# Patient Record
Sex: Male | Born: 1955 | Race: Black or African American | Hispanic: No | Marital: Married | Smoking: Former smoker
Health system: Southern US, Community
[De-identification: ages and names within clinical notes are randomized; demographics above are authoritative.]

## PROBLEM LIST (undated history)

## (undated) DIAGNOSIS — E785 Hyperlipidemia, unspecified: Secondary | ICD-10-CM

## (undated) DIAGNOSIS — I10 Essential (primary) hypertension: Secondary | ICD-10-CM

## (undated) DIAGNOSIS — E119 Type 2 diabetes mellitus without complications: Secondary | ICD-10-CM

## (undated) HISTORY — DX: Type 2 diabetes mellitus without complications: E11.9

## (undated) HISTORY — DX: Essential (primary) hypertension: I10

## (undated) HISTORY — PX: OTHER SURGICAL HISTORY: SHX169

## (undated) HISTORY — DX: Hyperlipidemia, unspecified: E78.5

---

## 2000-06-26 ENCOUNTER — Encounter: Admission: RE | Admit: 2000-06-26 | Discharge: 2000-09-24 | Payer: Self-pay | Admitting: Emergency Medicine

## 2003-02-12 ENCOUNTER — Encounter: Admission: RE | Admit: 2003-02-12 | Discharge: 2003-02-12 | Payer: Self-pay | Admitting: Emergency Medicine

## 2003-02-22 ENCOUNTER — Encounter: Admission: RE | Admit: 2003-02-22 | Discharge: 2003-02-22 | Payer: Self-pay | Admitting: Emergency Medicine

## 2003-07-23 ENCOUNTER — Encounter: Admission: RE | Admit: 2003-07-23 | Discharge: 2003-07-23 | Payer: Self-pay | Admitting: Emergency Medicine

## 2003-10-07 ENCOUNTER — Ambulatory Visit (HOSPITAL_BASED_OUTPATIENT_CLINIC_OR_DEPARTMENT_OTHER): Admission: RE | Admit: 2003-10-07 | Discharge: 2003-10-07 | Payer: Self-pay | Admitting: Orthopedic Surgery

## 2003-10-07 ENCOUNTER — Ambulatory Visit (HOSPITAL_COMMUNITY): Admission: RE | Admit: 2003-10-07 | Discharge: 2003-10-07 | Payer: Self-pay | Admitting: Orthopedic Surgery

## 2008-12-29 ENCOUNTER — Ambulatory Visit (HOSPITAL_BASED_OUTPATIENT_CLINIC_OR_DEPARTMENT_OTHER): Admission: RE | Admit: 2008-12-29 | Discharge: 2008-12-29 | Payer: Self-pay | Admitting: Orthopedic Surgery

## 2009-03-21 ENCOUNTER — Emergency Department (HOSPITAL_COMMUNITY): Admission: EM | Admit: 2009-03-21 | Discharge: 2009-03-21 | Payer: Self-pay | Admitting: Emergency Medicine

## 2010-02-12 ENCOUNTER — Encounter: Payer: Self-pay | Admitting: Emergency Medicine

## 2010-04-25 LAB — GLUCOSE, CAPILLARY
Glucose-Capillary: 146 mg/dL — ABNORMAL HIGH (ref 70–99)
Glucose-Capillary: 177 mg/dL — ABNORMAL HIGH (ref 70–99)

## 2010-04-25 LAB — BASIC METABOLIC PANEL
BUN: 13 mg/dL (ref 6–23)
CO2: 31 mEq/L (ref 19–32)
Calcium: 9.1 mg/dL (ref 8.4–10.5)
Chloride: 103 mEq/L (ref 96–112)
Creatinine, Ser: 1.02 mg/dL (ref 0.4–1.5)
GFR calc Af Amer: >60 mL/min (ref >60)
GFR calc non Af Amer: >60 mL/min (ref >60)
Glucose, Bld: 178 mg/dL — ABNORMAL HIGH (ref 70–99)
Potassium: 3.8 mEq/L (ref 3.5–5.1)
Sodium: 137 mEq/L (ref 135–145)

## 2010-04-25 LAB — POCT HEMOGLOBIN-HEMACUE: Hemoglobin: 14.2 g/dL (ref 13.0–17.0)

## 2010-06-09 NOTE — Op Note (Signed)
NAME:  Andrew Woods, Andrew Woods NO.:  [id number]   MEDICAL RECORD NO.:  [id number]                   PATIENT TYPE:  AMB   LOCATION:  DSC                                  FACILITY:  [facility]   PHYSICIAN:  Loreta Ave, M.D.              DATE OF BIRTH:  02/27/1955   DATE OF PROCEDURE:  10/07/2003  DATE OF DISCHARGE:                                 OPERATIVE REPORT   PREOPERATIVE DIAGNOSES:  Chronic impingement with distal clavicle  osteolysis, left shoulder.  Complete rotator cuff tear anterior aspect.   POSTOPERATIVE DIAGNOSES:  Chronic impingement with distal clavicle  osteolysis, left shoulder.  Complete rotator cuff tear anterior aspect.  Associated anterior labrum tear.   OPERATION PERFORMED:  Left shoulder examination under anesthesia,  arthroscopy, debridement of labrum and undersurface of rotator cuff.  Acromioplasty with coracoacromial ligament release.  Bursectomy.  Excision  of distal clavicle.  Mini open repair, rotator cuff tear with FiberWire  suture and Concept repair system.   SURGEON:  Loreta Ave, M.D.   ASSISTANT:  Arlys John D. Petrarca, P.A.-C.   ANESTHESIA:  General.   ESTIMATED BLOOD LOSS:  Minimal.   SPECIMENS:  None.   CULTURES:  None.   COMPLICATIONS:  None.   DRESSING:  Soft compressive with shoulder immobilizer.   DESCRIPTION OF PROCEDURE:  The patient was brought to the operating room and  after adequate anesthesia had been obtained, the left shoulder was examined.  Full motion, good stability.  Placed in a beach chair position on the  shoulder positioner, prepped and draped in the usual sterile fashion.  Three  portals created, anterior, posterior, lateral.  Shoulder entered with blunt  obturator, distended and inspected.  Roughening complex tear in the anterior  labrum debrided.  Biceps tendon, biceps anchor, capsular ligamentous  structures all intact.  Articular cartilage looked good.  Undersurface  rotator cuff  tearing.  Anterior aspect supraspinatus.  A thin film of  capsule was still intact from below.  Displaced flaps debrided.  Cannula  redirected subacromially.  Type 3 acromion.  Bursa resected.  Cuff debrided.  Confirmation full thickness tearing anterior aspect supraspinatus tendon,  minimal retraction.  Remaining cuff looked good.  Acromioplasty to a type 1  acromion with a shaver and high speed bur.  CA ligament released with  cautery.  Distal clavicle grade 4 changes with osteolysis.  Lateral  centimeter resected.  Adequacy of decompression and clavicle excision  confirmed viewing from all portals.  Instruments and fluid removed.  Deltoid  splitting incision through lateral portal.  Subacromial space accessed.  Adequacy of decompression confirmed.  Cuff debrided back to healthy tissue  over the anterior half supraspinatus tendon.  Mobilized and then captured  with a weave FiberWire suture.  Reimplanted down the tuberosity at an area  of bone that had been roughened with a bur.  Concept repair system used to  weave the FiberWire  sutures through the humerus and then firmly tied over a  bony bridge laterally.  Nice firm watertight closure, reattachment of the  cuff and good repair.  Full passive motion without undue tension.  Wound  irrigated.  Deltoid and skin closed with Vicryl. Portals closed with nylon.  Margins of wound injected with Marcaine.  Sterile compressive dressing  applied.  Anesthesia reversed.  Brought to recovery room.  Tolerated surgery  well without complication.                                               Loreta Ave, M.D.    DFM/MEDQ  D:  10/07/2003  T:  10/07/2003  Job:  [id number]

## 2013-04-09 ENCOUNTER — Ambulatory Visit (INDEPENDENT_AMBULATORY_CARE_PROVIDER_SITE_OTHER): Payer: 59 | Admitting: Podiatry

## 2013-04-09 ENCOUNTER — Encounter: Payer: Self-pay | Admitting: Podiatry

## 2013-04-09 VITALS — BP 112/67 | HR 82 | Resp 16 | Ht 68.0 in | Wt 190.0 lb

## 2013-04-09 DIAGNOSIS — B372 Candidiasis of skin and nail: Secondary | ICD-10-CM

## 2013-04-09 DIAGNOSIS — L259 Unspecified contact dermatitis, unspecified cause: Secondary | ICD-10-CM

## 2013-04-09 MED ORDER — TERBINAFINE HCL 250 MG PO TABS: 250.0000 mg | ORAL_TABLET | Freq: Every day | ORAL | Status: DC

## 2013-04-09 NOTE — Progress Notes (Signed)
   Subjective:    Patient ID: Andrew Woods, male    DOB: Feb 24, 1955, 58 y.o.   MRN: [id number]  HPI Comments: "I have these moist areas between my toes"  Patient c/o of macerated areas between toes bilateral, especially between 3rd and 4th toes bilateral. They have been like this for years, just getting worse recently. Says they itch a lot. The bottoms of feet peel and crack. Tried using a lotion to dry the areas up-no help. PCP thought fungus and referred here for treatment.     Review of Systems  All other systems reviewed and are negative.       Objective:   Physical Exam        Assessment & Plan:

## 2013-04-09 NOTE — Patient Instructions (Addendum)
Diabetes and Foot Care Diabetes may cause you to have problems because of poor blood supply (circulation) to your feet and legs. This may cause the skin on your feet to become thinner, break easier, and heal more slowly. Your skin may become dry, and the skin may peel and crack. You may also have nerve damage in your legs and feet causing decreased feeling in them. You may not notice minor injuries to your feet that could lead to infections or more serious problems. Taking care of your feet is one of the most important things you can do for yourself.  HOME CARE INSTRUCTIONS  Wear shoes at all times, even in the house. Do not go barefoot. Bare feet are easily injured.  Check your feet daily for blisters, cuts, and redness. If you cannot see the bottom of your feet, use a mirror or ask someone for help.  Wash your feet with warm water (do not use hot water) and mild soap. Then pat your feet and the areas between your toes until they are completely dry. Do not soak your feet as this can dry your skin.  Apply a moisturizing lotion or petroleum jelly (that does not contain alcohol and is unscented) to the skin on your feet and to dry, brittle toenails. Do not apply lotion between your toes.  Trim your toenails straight across. Do not dig under them or around the cuticle. File the edges of your nails with an emery board or nail file.  Do not cut corns or calluses or try to remove them with medicine.  Wear clean socks or stockings every day. Make sure they are not too tight. Do not wear knee-high stockings since they may decrease blood flow to your legs.  Wear shoes that fit properly and have enough cushioning. To break in new shoes, wear them for just a few hours a day. This prevents you from injuring your feet. Always look in your shoes before you put them on to be sure there are no objects inside.  Do not cross your legs. This may decrease the blood flow to your feet.  If you find a minor scrape,  cut, or break in the skin on your feet, keep it and the skin around it clean and dry. These areas may be cleansed with mild soap and water. Do not cleanse the area with peroxide, alcohol, or iodine.  When you remove an adhesive bandage, be sure not to damage the skin around it.  If you have a wound, look at it several times a day to make sure it is healing.  Do not use heating pads or hot water bottles. They may burn your skin. If you have lost feeling in your feet or legs, you may not know it is happening until it is too late.  Make sure your health care provider performs a complete foot exam at least annually or more often if you have foot problems. Report any cuts, sores, or bruises to your health care provider immediately. SEEK MEDICAL CARE IF:   You have an injury that is not healing.  You have cuts or breaks in the skin.  You have an ingrown nail.  You notice redness on your legs or feet.  You feel burning or tingling in your legs or feet.  You have pain or cramps in your legs and feet.  Your legs or feet are numb.  Your feet always feel cold. SEEK IMMEDIATE MEDICAL CARE IF:   There is increasing redness,   swelling, or pain in or around a wound.  There is a red line that goes up your leg.  Pus is coming from a wound.  You develop a fever or as directed by your health care provider.  You notice a bad smell coming from an ulcer or wound. Document Released: 01/06/2000 Document Revised: 09/10/2012 Document Reviewed: 06/17/2012 [facility] Patient Information 2014 [place].  vaseline 2x week before bed and wrap in saran wrap and white sock

## 2013-04-10 NOTE — Progress Notes (Signed)
Subjective:     Patient ID: Andrew Woods, male   DOB: 1955-09-10, 58 y.o.   MRN: [id number]  HPI patient presents with six-year history of diabetes and dryness of his plantar skin and irritation between the digits of both feet of long-term duration  Review of Systems  All other systems reviewed and are negative.       Objective:   Physical Exam  Nursing note and vitals reviewed. Constitutional: He is oriented to person, place, and time.  Cardiovascular: Intact distal pulses.   Musculoskeletal: Normal range of motion.  Neurological: He is oriented to person, place, and time.  Skin: Skin is warm.   neurovascular status intact with muscle strength adequate and sharp dull vibratory intact. Significant dryness plantar aspect of both feet and irritation between the lesser digits of both feet with normal range of motion and no equinus condition noted.    Assessment:     Doing well with diabetes and his feet but does have dry foot condition    Plan:     Recommended Vaseline under occlusion revitaderm and Naftin for him between his toes. Reappoint her recheck as needed

## 2013-04-29 ENCOUNTER — Encounter: Payer: 59 | Attending: Geriatric Medicine | Admitting: *Deleted

## 2013-04-29 ENCOUNTER — Encounter: Payer: Self-pay | Admitting: *Deleted

## 2013-04-29 VITALS — Ht 68.0 in | Wt 196.9 lb

## 2013-04-29 DIAGNOSIS — Z713 Dietary counseling and surveillance: Secondary | ICD-10-CM | POA: Insufficient documentation

## 2013-04-29 DIAGNOSIS — E119 Type 2 diabetes mellitus without complications: Secondary | ICD-10-CM | POA: Insufficient documentation

## 2013-04-29 NOTE — Progress Notes (Signed)
Appt start time: 1100 end time:  1230.  Assessment:  Patient was seen on  04/29/13 for individual diabetes education. History of Diabetes for past 10 years. Lives with wife, she shops for the food and cooks most meals. 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 SMBG once a day or more with reported range of 30 day average of 115 mg/dl and 14 day average of 95 as FBG. He states he has changed his eating habits and reduced beer intake over the past month. He enjoys motorcycling and bicycling.  States he ate more sweets during the holidays, but last month he has been much more careful.  Current HbA1c: 9.0%  Preferred Learning Style:   No preference indicated   Learning Readiness:   Ready  Change in progress  MEDICATIONS: see list, diabetes medications are Glipizide, Metformin and Tradjenta  DIETARY INTAKE:  24-hr recall:  B ( AM): biscuit from fast food OR fruit and fiber cereal with skim milk and banana, water to drink Snk ( AM): occasionally PNB crackers  L ( PM): brings from home: lean meat sandwich, with chips and or canned fruit, water or diet soda Snk ( PM): unsalted nuts and fresh fruit D ( PM): meat, vegetables, starchy vegetables, occasionally a whole grain starch, water Snk ( PM): not usually, maybe a handful of chips occasionally Beverages: water, diet soda  Usual physical activity: is active with his job, does yard work, occasionally rides bike  Estimated energy needs: 1600 calories 180 g carbohydrates 120 g protein 44 g fat  Progress Towards Goal(s):  Resolved.   Nutritional Diagnosis:  NB-1.1 Food and nutrition-related knowledge deficit As related to diabetes management.  As evidenced by history of A1c of 9.0%.    Intervention:  Nutrition counseling provided.  Discussed diabetes disease process and treatment options.  Discussed physiology of diabetes and role of obesity on insulin resistance.  Encouraged moderate weight  reduction to improve glucose levels.  Discussed role of medications and diet in glucose control  Provided education on macronutrients on glucose levels.  Provided education on carb counting, importance of regularly scheduled meals/snacks, and meal planning  Discussed effects of physical activity on glucose levels and long-term glucose control.  Recommended goal of 150 minutes of physical activity/week.  Reviewed patient medications.  Discussed role of medication on blood glucose and possible side effects  Discussed blood glucose monitoring and interpretation.  Discussed recommended target ranges and individual ranges.    Described short-term complications: hyper- and hypo-glycemia.  Discussed causes,symptoms, and treatment options.  Discussed prevention, detection, and treatment of long-term complications.  Discussed the role of prolonged elevated glucose levels on body systems.  Discussed role of stress on blood glucose levels and discussed strategies to manage psychosocial issues.  Discussed recommendations for long-term diabetes self-care.  Provided checklist for medical, dental, and emotional self-care.  Plan:  Aim for 4 Carb Choices per meal (60 grams) +/- 1 either way  Aim for 0-2 Carbs per snack if hungry Include protein in moderation with your meals and snacks as needed  Consider reading food labels for Total Carbohydrate of foods Consider  increasing your activity level as tolerated Consider checking BG at alternate times per day  Teaching Method Utilized: Visual, Auditory and Hands on  Handouts given during visit include: Living Well with Diabetes Carb Counting and Food Label handouts Meal Plan Card Diabetes Medication List  Barriers to learning/adherence to lifestyle change: none  Diabetes self-care support plan:  [facility] support group  Demonstrated degree of understanding via:  Teach Back   Monitoring/Evaluation:  Dietary intake, exercise, reading food labels,  SMBG, and body weight prn.

## 2013-04-29 NOTE — Patient Instructions (Signed)
Plan:  Aim for 4 Carb Choices per meal (60 grams) +/- 1 either way  Aim for 0-2 Carbs per snack if hungry Include protein in moderation with your meals and snacks as needed  Consider reading food labels for Total Carbohydrate of foods Consider  increasing your activity level as tolerated Consider checking BG at alternate times per day

## 2013-05-03 ENCOUNTER — Other Ambulatory Visit: Payer: Self-pay | Admitting: Podiatry

## 2013-05-04 NOTE — Telephone Encounter (Signed)
If pt continues to have problems, he needs to be evaluated.

## 2013-05-08 ENCOUNTER — Ambulatory Visit: Payer: Self-pay | Admitting: Dietician

## 2013-06-11 ENCOUNTER — Ambulatory Visit: Payer: 59 | Admitting: Podiatry

## 2015-02-09 ENCOUNTER — Encounter: Payer: Self-pay | Admitting: Podiatry

## 2015-02-09 ENCOUNTER — Ambulatory Visit (INDEPENDENT_AMBULATORY_CARE_PROVIDER_SITE_OTHER): Payer: 59 | Admitting: Podiatry

## 2015-02-09 VITALS — BP 131/80 | HR 82 | Resp 16

## 2015-02-09 DIAGNOSIS — B372 Candidiasis of skin and nail: Secondary | ICD-10-CM | POA: Diagnosis not present

## 2015-02-09 MED ORDER — TERBINAFINE HCL 250 MG PO TABS: 250.0000 mg | ORAL_TABLET | Freq: Every day | ORAL | Status: DC

## 2015-02-10 NOTE — Progress Notes (Signed)
Subjective:     Patient ID: Andrew Woods, male   DOB: [birth year], 58 y.o.   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 He states he gets regular liver function studies and they have been normal

## 2015-03-17 ENCOUNTER — Other Ambulatory Visit: Payer: Self-pay | Admitting: Podiatry

## 2015-03-17 NOTE — Telephone Encounter (Signed)
Pt has completed #60 doses of Lamisil for interdigital fungal infection, and is requesting refill.

## 2015-04-28 ENCOUNTER — Other Ambulatory Visit: Payer: Self-pay | Admitting: Podiatry

## 2015-04-28 NOTE — Telephone Encounter (Signed)
[facility] sent electronic notice for Terbinafine refill.  I spoke with pt and he states he doesn't need that medication.  I encouraged pt to contact [facility] and get correct medication ordered.  Pt agreed.

## 2016-03-23 DIAGNOSIS — I10 Essential (primary) hypertension: Secondary | ICD-10-CM | POA: Diagnosis not present

## 2016-03-23 DIAGNOSIS — E1165 Type 2 diabetes mellitus with hyperglycemia: Secondary | ICD-10-CM | POA: Diagnosis not present

## 2016-05-24 DIAGNOSIS — H524 Presbyopia: Secondary | ICD-10-CM | POA: Diagnosis not present

## 2016-05-24 DIAGNOSIS — H2513 Age-related nuclear cataract, bilateral: Secondary | ICD-10-CM | POA: Diagnosis not present

## 2016-05-24 DIAGNOSIS — E119 Type 2 diabetes mellitus without complications: Secondary | ICD-10-CM | POA: Diagnosis not present

## 2016-06-29 DIAGNOSIS — E1165 Type 2 diabetes mellitus with hyperglycemia: Secondary | ICD-10-CM | POA: Diagnosis not present

## 2016-09-28 DIAGNOSIS — E119 Type 2 diabetes mellitus without complications: Secondary | ICD-10-CM | POA: Diagnosis not present

## 2016-09-28 DIAGNOSIS — E1165 Type 2 diabetes mellitus with hyperglycemia: Secondary | ICD-10-CM | POA: Diagnosis not present

## 2016-09-28 DIAGNOSIS — I10 Essential (primary) hypertension: Secondary | ICD-10-CM | POA: Diagnosis not present

## 2016-09-28 DIAGNOSIS — Z79899 Other long term (current) drug therapy: Secondary | ICD-10-CM | POA: Diagnosis not present

## 2017-01-11 DIAGNOSIS — I10 Essential (primary) hypertension: Secondary | ICD-10-CM | POA: Diagnosis not present

## 2017-01-11 DIAGNOSIS — E7800 Pure hypercholesterolemia, unspecified: Secondary | ICD-10-CM | POA: Diagnosis not present

## 2017-01-11 DIAGNOSIS — E119 Type 2 diabetes mellitus without complications: Secondary | ICD-10-CM | POA: Diagnosis not present

## 2017-01-11 DIAGNOSIS — Z0000 Encounter for general adult medical examination without abnormal findings: Secondary | ICD-10-CM | POA: Diagnosis not present

## 2017-01-11 DIAGNOSIS — Z79899 Other long term (current) drug therapy: Secondary | ICD-10-CM | POA: Diagnosis not present

## 2017-03-15 ENCOUNTER — Other Ambulatory Visit: Payer: Self-pay | Admitting: Geriatric Medicine

## 2017-03-15 DIAGNOSIS — I10 Essential (primary) hypertension: Secondary | ICD-10-CM | POA: Diagnosis not present

## 2017-03-15 DIAGNOSIS — M5441 Lumbago with sciatica, right side: Secondary | ICD-10-CM | POA: Diagnosis not present

## 2017-03-15 DIAGNOSIS — E119 Type 2 diabetes mellitus without complications: Secondary | ICD-10-CM | POA: Diagnosis not present

## 2017-03-20 ENCOUNTER — Other Ambulatory Visit: Payer: Self-pay

## 2017-03-22 ENCOUNTER — Other Ambulatory Visit: Payer: Self-pay

## 2017-04-19 DIAGNOSIS — I10 Essential (primary) hypertension: Secondary | ICD-10-CM | POA: Diagnosis not present

## 2017-04-19 DIAGNOSIS — E1165 Type 2 diabetes mellitus with hyperglycemia: Secondary | ICD-10-CM | POA: Diagnosis not present

## 2017-04-19 DIAGNOSIS — M5441 Lumbago with sciatica, right side: Secondary | ICD-10-CM | POA: Diagnosis not present

## 2017-04-29 ENCOUNTER — Other Ambulatory Visit: Payer: Self-pay

## 2017-05-11 ENCOUNTER — Ambulatory Visit
Admission: RE | Admit: 2017-05-11 | Discharge: 2017-05-11 | Disposition: A | Discharge status: Home / Self Care | Payer: 59 | Source: Ambulatory Visit | Attending: Geriatric Medicine | Admitting: Geriatric Medicine

## 2017-05-11 DIAGNOSIS — M5416 Radiculopathy, lumbar region: Secondary | ICD-10-CM | POA: Diagnosis not present

## 2017-05-11 DIAGNOSIS — M5441 Lumbago with sciatica, right side: Secondary | ICD-10-CM

## 2017-05-15 DIAGNOSIS — S30871A Other superficial bite of abdominal wall, initial encounter: Secondary | ICD-10-CM | POA: Diagnosis not present

## 2017-05-15 DIAGNOSIS — S20479A Other superficial bite of unspecified back wall of thorax, initial encounter: Secondary | ICD-10-CM | POA: Diagnosis not present

## 2017-05-15 DIAGNOSIS — S40869A Insect bite (nonvenomous) of unspecified upper arm, initial encounter: Secondary | ICD-10-CM | POA: Diagnosis not present

## 2017-05-17 DIAGNOSIS — E119 Type 2 diabetes mellitus without complications: Secondary | ICD-10-CM | POA: Diagnosis not present

## 2017-05-17 DIAGNOSIS — E1165 Type 2 diabetes mellitus with hyperglycemia: Secondary | ICD-10-CM | POA: Diagnosis not present

## 2017-05-17 DIAGNOSIS — I10 Essential (primary) hypertension: Secondary | ICD-10-CM | POA: Diagnosis not present

## 2017-05-24 DIAGNOSIS — H524 Presbyopia: Secondary | ICD-10-CM | POA: Diagnosis not present

## 2017-05-24 DIAGNOSIS — Z87828 Personal history of other (healed) physical injury and trauma: Secondary | ICD-10-CM | POA: Diagnosis not present

## 2017-05-24 DIAGNOSIS — E119 Type 2 diabetes mellitus without complications: Secondary | ICD-10-CM | POA: Diagnosis not present

## 2017-05-24 DIAGNOSIS — L299 Pruritus, unspecified: Secondary | ICD-10-CM | POA: Diagnosis not present

## 2017-05-24 DIAGNOSIS — H349 Unspecified retinal vascular occlusion: Secondary | ICD-10-CM | POA: Diagnosis not present

## 2017-05-30 DIAGNOSIS — M5416 Radiculopathy, lumbar region: Secondary | ICD-10-CM | POA: Diagnosis not present

## 2017-05-30 DIAGNOSIS — M5136 Other intervertebral disc degeneration, lumbar region: Secondary | ICD-10-CM | POA: Diagnosis not present

## 2017-06-04 DIAGNOSIS — H34831 Tributary (branch) retinal vein occlusion, right eye, with macular edema: Secondary | ICD-10-CM | POA: Diagnosis not present

## 2017-06-19 DIAGNOSIS — M25551 Pain in right hip: Secondary | ICD-10-CM | POA: Diagnosis not present

## 2017-06-19 DIAGNOSIS — M545 Low back pain: Secondary | ICD-10-CM | POA: Diagnosis not present

## 2017-06-19 DIAGNOSIS — M25651 Stiffness of right hip, not elsewhere classified: Secondary | ICD-10-CM | POA: Diagnosis not present

## 2017-06-21 DIAGNOSIS — M545 Low back pain: Secondary | ICD-10-CM | POA: Diagnosis not present

## 2017-06-21 DIAGNOSIS — M25551 Pain in right hip: Secondary | ICD-10-CM | POA: Diagnosis not present

## 2017-06-21 DIAGNOSIS — M25651 Stiffness of right hip, not elsewhere classified: Secondary | ICD-10-CM | POA: Diagnosis not present

## 2017-06-27 DIAGNOSIS — M25551 Pain in right hip: Secondary | ICD-10-CM | POA: Diagnosis not present

## 2017-06-27 DIAGNOSIS — M545 Low back pain: Secondary | ICD-10-CM | POA: Diagnosis not present

## 2017-06-27 DIAGNOSIS — M25651 Stiffness of right hip, not elsewhere classified: Secondary | ICD-10-CM | POA: Diagnosis not present

## 2017-06-28 DIAGNOSIS — M25651 Stiffness of right hip, not elsewhere classified: Secondary | ICD-10-CM | POA: Diagnosis not present

## 2017-06-28 DIAGNOSIS — M25551 Pain in right hip: Secondary | ICD-10-CM | POA: Diagnosis not present

## 2017-06-28 DIAGNOSIS — M545 Low back pain: Secondary | ICD-10-CM | POA: Diagnosis not present

## 2017-07-02 DIAGNOSIS — H34831 Tributary (branch) retinal vein occlusion, right eye, with macular edema: Secondary | ICD-10-CM | POA: Diagnosis not present

## 2017-07-02 DIAGNOSIS — E119 Type 2 diabetes mellitus without complications: Secondary | ICD-10-CM | POA: Diagnosis not present

## 2017-07-03 DIAGNOSIS — M545 Low back pain: Secondary | ICD-10-CM | POA: Diagnosis not present

## 2017-07-03 DIAGNOSIS — M25551 Pain in right hip: Secondary | ICD-10-CM | POA: Diagnosis not present

## 2017-07-03 DIAGNOSIS — M25651 Stiffness of right hip, not elsewhere classified: Secondary | ICD-10-CM | POA: Diagnosis not present

## 2017-07-05 DIAGNOSIS — M25651 Stiffness of right hip, not elsewhere classified: Secondary | ICD-10-CM | POA: Diagnosis not present

## 2017-07-05 DIAGNOSIS — M545 Low back pain: Secondary | ICD-10-CM | POA: Diagnosis not present

## 2017-07-05 DIAGNOSIS — M25551 Pain in right hip: Secondary | ICD-10-CM | POA: Diagnosis not present

## 2017-07-09 DIAGNOSIS — M25651 Stiffness of right hip, not elsewhere classified: Secondary | ICD-10-CM | POA: Diagnosis not present

## 2017-07-09 DIAGNOSIS — M25551 Pain in right hip: Secondary | ICD-10-CM | POA: Diagnosis not present

## 2017-07-09 DIAGNOSIS — M545 Low back pain: Secondary | ICD-10-CM | POA: Diagnosis not present

## 2017-07-12 DIAGNOSIS — M25651 Stiffness of right hip, not elsewhere classified: Secondary | ICD-10-CM | POA: Diagnosis not present

## 2017-07-12 DIAGNOSIS — M25551 Pain in right hip: Secondary | ICD-10-CM | POA: Diagnosis not present

## 2017-07-12 DIAGNOSIS — M545 Low back pain: Secondary | ICD-10-CM | POA: Diagnosis not present

## 2017-07-15 DIAGNOSIS — M25551 Pain in right hip: Secondary | ICD-10-CM | POA: Diagnosis not present

## 2017-07-15 DIAGNOSIS — M545 Low back pain: Secondary | ICD-10-CM | POA: Diagnosis not present

## 2017-07-15 DIAGNOSIS — M25651 Stiffness of right hip, not elsewhere classified: Secondary | ICD-10-CM | POA: Diagnosis not present

## 2017-07-16 DIAGNOSIS — H34831 Tributary (branch) retinal vein occlusion, right eye, with macular edema: Secondary | ICD-10-CM | POA: Diagnosis not present

## 2017-07-29 DIAGNOSIS — M25551 Pain in right hip: Secondary | ICD-10-CM | POA: Diagnosis not present

## 2017-07-29 DIAGNOSIS — M545 Low back pain: Secondary | ICD-10-CM | POA: Diagnosis not present

## 2017-07-29 DIAGNOSIS — M25651 Stiffness of right hip, not elsewhere classified: Secondary | ICD-10-CM | POA: Diagnosis not present

## 2017-08-29 DIAGNOSIS — M5416 Radiculopathy, lumbar region: Secondary | ICD-10-CM | POA: Diagnosis not present

## 2017-09-09 DIAGNOSIS — E118 Type 2 diabetes mellitus with unspecified complications: Secondary | ICD-10-CM | POA: Diagnosis not present

## 2017-09-09 DIAGNOSIS — H5213 Myopia, bilateral: Secondary | ICD-10-CM | POA: Diagnosis not present

## 2017-09-09 DIAGNOSIS — H348312 Tributary (branch) retinal vein occlusion, right eye, stable: Secondary | ICD-10-CM | POA: Diagnosis not present

## 2017-09-20 DIAGNOSIS — Z79899 Other long term (current) drug therapy: Secondary | ICD-10-CM | POA: Diagnosis not present

## 2017-09-20 DIAGNOSIS — E7800 Pure hypercholesterolemia, unspecified: Secondary | ICD-10-CM | POA: Diagnosis not present

## 2017-09-20 DIAGNOSIS — E1165 Type 2 diabetes mellitus with hyperglycemia: Secondary | ICD-10-CM | POA: Diagnosis not present

## 2017-09-20 DIAGNOSIS — I10 Essential (primary) hypertension: Secondary | ICD-10-CM | POA: Diagnosis not present

## 2017-09-20 DIAGNOSIS — E1169 Type 2 diabetes mellitus with other specified complication: Secondary | ICD-10-CM | POA: Diagnosis not present

## 2017-10-09 DIAGNOSIS — H34831 Tributary (branch) retinal vein occlusion, right eye, with macular edema: Secondary | ICD-10-CM | POA: Diagnosis not present

## 2017-10-09 DIAGNOSIS — H35413 Lattice degeneration of retina, bilateral: Secondary | ICD-10-CM | POA: Diagnosis not present

## 2017-10-09 DIAGNOSIS — H3561 Retinal hemorrhage, right eye: Secondary | ICD-10-CM | POA: Diagnosis not present

## 2017-10-23 DIAGNOSIS — E113592 Type 2 diabetes mellitus with proliferative diabetic retinopathy without macular edema, left eye: Secondary | ICD-10-CM | POA: Diagnosis not present

## 2017-10-23 DIAGNOSIS — H34831 Tributary (branch) retinal vein occlusion, right eye, with macular edema: Secondary | ICD-10-CM | POA: Diagnosis not present

## 2017-11-20 DIAGNOSIS — H34831 Tributary (branch) retinal vein occlusion, right eye, with macular edema: Secondary | ICD-10-CM | POA: Diagnosis not present

## 2017-11-20 DIAGNOSIS — H3561 Retinal hemorrhage, right eye: Secondary | ICD-10-CM | POA: Diagnosis not present

## 2017-11-20 DIAGNOSIS — H35413 Lattice degeneration of retina, bilateral: Secondary | ICD-10-CM | POA: Diagnosis not present

## 2017-11-20 DIAGNOSIS — H2513 Age-related nuclear cataract, bilateral: Secondary | ICD-10-CM | POA: Diagnosis not present

## 2017-12-23 ENCOUNTER — Ambulatory Visit (INDEPENDENT_AMBULATORY_CARE_PROVIDER_SITE_OTHER): Payer: 59 | Admitting: Podiatry

## 2017-12-23 ENCOUNTER — Other Ambulatory Visit: Payer: Self-pay | Admitting: Podiatry

## 2017-12-23 ENCOUNTER — Ambulatory Visit (INDEPENDENT_AMBULATORY_CARE_PROVIDER_SITE_OTHER): Payer: 59

## 2017-12-23 ENCOUNTER — Encounter: Payer: Self-pay | Admitting: Podiatry

## 2017-12-23 DIAGNOSIS — D492 Neoplasm of unspecified behavior of bone, soft tissue, and skin: Secondary | ICD-10-CM | POA: Diagnosis not present

## 2017-12-23 DIAGNOSIS — M7989 Other specified soft tissue disorders: Secondary | ICD-10-CM | POA: Diagnosis not present

## 2017-12-23 DIAGNOSIS — M79675 Pain in left toe(s): Secondary | ICD-10-CM

## 2017-12-23 DIAGNOSIS — D169 Benign neoplasm of bone and articular cartilage, unspecified: Secondary | ICD-10-CM | POA: Diagnosis not present

## 2017-12-23 NOTE — Patient Instructions (Signed)
Pre-Operative Instructions  Congratulations, you have decided to take an important step towards improving your quality of life.  You can be assured that the doctors and staff at [facility] will be with you every step of the way.  Here are some important things you should know:  1. Plan to be at the surgery center/hospital at least 1 (one) hour prior to your scheduled time, unless otherwise directed by the surgical center/hospital staff.  You must have a responsible adult accompany you, remain during the surgery and drive you home.  Make sure you have directions to the surgical center/hospital to ensure you arrive on time. 2. If you are having surgery at [facility] or [facility] hospitals, you will need a copy of your medical history and physical form from your family physician within one month prior to the date of surgery. We will give you a form for your primary physician to complete.  3. We make every effort to accommodate the date you request for surgery.  However, there are times where surgery dates or times have to be moved.  We will contact you as soon as possible if a change in schedule is required.   4. No aspirin/ibuprofen for one week before surgery.  If you are on aspirin, any non-steroidal anti-inflammatory medications (Mobic, Aleve, Ibuprofen) should not be taken seven (7) days prior to your surgery.  You make take Tylenol for pain prior to surgery.  5. Medications - If you are taking daily heart and blood pressure medications, seizure, reflux, allergy, asthma, anxiety, pain or diabetes medications, make sure you notify the surgery center/hospital before the day of surgery so they can tell you which medications you should take or avoid the day of surgery. 6. No food or drink after midnight the night before surgery unless directed otherwise by surgical center/hospital staff. 7. No alcoholic beverages 24-hours prior to surgery.  No smoking 24-hours prior or 24-hours after  surgery. 8. Wear loose pants or shorts. They should be loose enough to fit over bandages, boots, and casts. 9. Don't wear slip-on shoes. Sneakers are preferred. 10. Bring your boot with you to the surgery center/hospital.  Also bring crutches or a walker if your physician has prescribed it for you.  If you do not have this equipment, it will be provided for you after surgery. 11. If you have not been contacted by the surgery center/hospital by the day before your surgery, call to confirm the date and time of your surgery. 12. Leave-time from work may vary depending on the type of surgery you have.  Appropriate arrangements should be made prior to surgery with your employer. 13. Prescriptions will be provided immediately following surgery by your doctor.  Fill these as soon as possible after surgery and take the medication as directed. Pain medications will not be refilled on weekends and must be approved by the doctor. 14. Remove nail polish on the operative foot and avoid getting pedicures prior to surgery. 15. Wash the night before surgery.  The night before surgery wash the foot and leg well with water and the antibacterial soap provided. Be sure to pay special attention to beneath the toenails and in between the toes.  Wash for at least three (3) minutes. Rinse thoroughly with water and dry well with a towel.  Perform this wash unless told not to do so by your physician.  Enclosed: 1 Ice pack (please put in freezer the night before surgery)   1 Hibiclens skin cleaner     Pre-op instructions  If you have any questions regarding the instructions, please do not hesitate to call our office.  [place]: [address], [place], [place] [address] -- [phone]  [place]: [address] [place], [place] [address] -- [phone]  [place]: [address]  [place], [place] [address] -- [phone]  [place]: [address], [address], [place], [place] [address] -- [phone]  Website:  [email or web address] 

## 2017-12-24 NOTE — Progress Notes (Signed)
Subjective:   Patient ID: Andrew Woods, male   DOB: 62 y.o.   MRN: [id number]   HPI Patient states I have this irritable lesion in the bottom of my left big toe that is making it increasingly hard to walk on and I know I need to get it removed.  It is been there for several years and worse over the last 6 months.  Patient does not smoke likes to be active   Review of Systems  All other systems reviewed and are negative.       Objective:  Physical Exam  Constitutional: He appears well-developed and well-nourished.  Cardiovascular: Intact distal pulses.  Pulmonary/Chest: Effort normal.  Musculoskeletal: Normal range of motion.  Neurological: He is alert.  Skin: Skin is warm.  Nursing note and vitals reviewed.   Neurovascular status intact muscle strength is adequate range of motion within normal limits with patient found to have a inflammation with a hard nodule plantar aspect left big toe just proximal to the inner phalangeal joint and slightly lateral.  It is painful when palpated and make shoe gear difficult and patient has good digital perfusion noted.  I questioned him on his sugar he states his A1c has been running below 7 and his fasting sugars have been running around 100     Assessment:  Plantar mass left which may be soft tissue and fibrous or it may possibly be interphalangeal joint sesamoid that irritated and painful     Plan:  H&P condition reviewed and discussed treatment options.  He wants to have a removed due to not knowing what it is and discomfort and it hopefully will be a very simple procedure for him.  At this point he wants to get it done soon and wants to do consent form today and I allowed him to read the consent form going over all possible complications alternative treatments and after extensive review he signed consent form.  He understands recovery will take several months and there is no guarantee as to what the lesion will be and if it does turn out to  be soft tissue we will send it to pathology.  Patient had air fracture walker dispensed as I want him completely immobilize his big toe after the procedure and he will get used to it prior to surgery and find shoes on his other foot that fit appropriately.  Patient is scheduled for outpatient surgery currently is encouraged to call with questions prior to the procedure  X-rays indicate there is an interphalangeal joint sesamoid right and I did not note any further calcification

## 2017-12-25 DIAGNOSIS — H35413 Lattice degeneration of retina, bilateral: Secondary | ICD-10-CM | POA: Diagnosis not present

## 2017-12-25 DIAGNOSIS — H34831 Tributary (branch) retinal vein occlusion, right eye, with macular edema: Secondary | ICD-10-CM | POA: Diagnosis not present

## 2017-12-25 DIAGNOSIS — H3561 Retinal hemorrhage, right eye: Secondary | ICD-10-CM | POA: Diagnosis not present

## 2017-12-25 DIAGNOSIS — Q141 Congenital malformation of retina: Secondary | ICD-10-CM | POA: Diagnosis not present

## 2018-01-24 DIAGNOSIS — H34831 Tributary (branch) retinal vein occlusion, right eye, with macular edema: Secondary | ICD-10-CM | POA: Diagnosis not present

## 2018-01-24 DIAGNOSIS — H35413 Lattice degeneration of retina, bilateral: Secondary | ICD-10-CM | POA: Diagnosis not present

## 2018-01-24 DIAGNOSIS — Q141 Congenital malformation of retina: Secondary | ICD-10-CM | POA: Diagnosis not present

## 2018-01-24 DIAGNOSIS — H3561 Retinal hemorrhage, right eye: Secondary | ICD-10-CM | POA: Diagnosis not present

## 2018-02-04 ENCOUNTER — Telehealth: Payer: Self-pay | Admitting: *Deleted

## 2018-02-04 ENCOUNTER — Encounter: Payer: Self-pay | Admitting: Podiatry

## 2018-02-04 DIAGNOSIS — M84872 Other disorders of continuity of bone, left ankle and foot: Secondary | ICD-10-CM | POA: Diagnosis not present

## 2018-02-04 DIAGNOSIS — M799 Soft tissue disorder, unspecified: Secondary | ICD-10-CM | POA: Diagnosis not present

## 2018-02-04 DIAGNOSIS — M79675 Pain in left toe(s): Secondary | ICD-10-CM | POA: Diagnosis not present

## 2018-02-04 NOTE — Telephone Encounter (Signed)
Juliann Pulse from [facility] returned my call about Andrew Woods' authorization for surgery.  She research and stated that there must have been a glitch in the system because as long as the surgery is done outpatient at an ambulatory facility it should have been authorized.  She said the surgery was authorized and I should receive a letter in the mail stating so.  She also said we could check the status online.  The authorization number is [id number].  I called and informed Caren Griffins, scheduler at [facility], about the authorization.

## 2018-02-10 ENCOUNTER — Encounter: Payer: Self-pay | Admitting: Podiatry

## 2018-02-10 ENCOUNTER — Ambulatory Visit (INDEPENDENT_AMBULATORY_CARE_PROVIDER_SITE_OTHER): Payer: 59

## 2018-02-10 ENCOUNTER — Ambulatory Visit (INDEPENDENT_AMBULATORY_CARE_PROVIDER_SITE_OTHER): Payer: 59 | Admitting: Podiatry

## 2018-02-10 VITALS — Temp 98.4°F

## 2018-02-10 DIAGNOSIS — D169 Benign neoplasm of bone and articular cartilage, unspecified: Secondary | ICD-10-CM

## 2018-02-10 DIAGNOSIS — Z09 Encounter for follow-up examination after completed treatment for conditions other than malignant neoplasm: Secondary | ICD-10-CM

## 2018-02-10 DIAGNOSIS — D492 Neoplasm of unspecified behavior of bone, soft tissue, and skin: Secondary | ICD-10-CM

## 2018-02-11 NOTE — Progress Notes (Signed)
Subjective:   Patient ID: Andrew Woods, male   DOB: 63 y.o.   MRN: [id number]   HPI Patient states having minimal discomfort in his foot and able to walk without pain   ROS      Objective:  Physical Exam  Neurovascular status intact with incision site plantar left doing well wound edges well coapted stitches intact and no plantar pain     Assessment:  Doing well after removal of bony nodule left plantar hallux that is probably related to inner phalangeal sesamoid     Plan:  H&P reviewed condition and advised to continued compression elevation and immobilization.  Reappoint 2 weeks for stitch removal or earlier if any issues should occur  X-ray indicates that there is a very small residual inner phalangeal sesamoid but it should not be pathological and everything else looks good with good flexor function when I tested

## 2018-02-14 DIAGNOSIS — E1165 Type 2 diabetes mellitus with hyperglycemia: Secondary | ICD-10-CM | POA: Diagnosis not present

## 2018-02-14 DIAGNOSIS — Z79899 Other long term (current) drug therapy: Secondary | ICD-10-CM | POA: Diagnosis not present

## 2018-02-14 DIAGNOSIS — Z0000 Encounter for general adult medical examination without abnormal findings: Secondary | ICD-10-CM | POA: Diagnosis not present

## 2018-02-14 DIAGNOSIS — E7800 Pure hypercholesterolemia, unspecified: Secondary | ICD-10-CM | POA: Diagnosis not present

## 2018-02-21 DIAGNOSIS — Q141 Congenital malformation of retina: Secondary | ICD-10-CM | POA: Diagnosis not present

## 2018-02-21 DIAGNOSIS — H34831 Tributary (branch) retinal vein occlusion, right eye, with macular edema: Secondary | ICD-10-CM | POA: Diagnosis not present

## 2018-02-21 DIAGNOSIS — H35413 Lattice degeneration of retina, bilateral: Secondary | ICD-10-CM | POA: Diagnosis not present

## 2018-02-21 DIAGNOSIS — H35032 Hypertensive retinopathy, left eye: Secondary | ICD-10-CM | POA: Diagnosis not present

## 2018-02-26 ENCOUNTER — Ambulatory Visit (INDEPENDENT_AMBULATORY_CARE_PROVIDER_SITE_OTHER): Payer: 59 | Admitting: Podiatry

## 2018-02-26 ENCOUNTER — Encounter: Payer: Self-pay | Admitting: Podiatry

## 2018-02-26 DIAGNOSIS — D492 Neoplasm of unspecified behavior of bone, soft tissue, and skin: Secondary | ICD-10-CM

## 2018-02-26 DIAGNOSIS — Z09 Encounter for follow-up examination after completed treatment for conditions other than malignant neoplasm: Secondary | ICD-10-CM

## 2018-02-26 NOTE — Progress Notes (Signed)
This patient presents the office for an evaluation of his left foot surgery  by Dr. Paulla Dolly.  Surgery was performed 2 weeks ago for the removal of a sesamoid bone left hallux.  He says he has been walking with his cam walker and not the surgical shoes which was dispensed last week.  He says he has been dealing with pain at the incision site left hallux.  He returns to the office today for suture removal.    Objective neurovascular status intact.  No pain is noted in the calf of the left leg.  Good wound coaptation with sutures intact in the plantar incision left hallux.  There is a thick callus that has developed at the incision site.  No evidence of any redness swelling at the surgical site.  S/P foot surgery  Treatment  ROV.  Sutures were removed from the surgical site  plantar aspect left hallux.  The thickened callus was debrided using a #15 blade.  Patient was told to use Vaseline in an effort to soften the incision site.  He is to return to the office in 2 weeks for an evaluation by Dr. Paulla Dolly.  Andrew Woods DPM

## 2018-03-10 DIAGNOSIS — H348312 Tributary (branch) retinal vein occlusion, right eye, stable: Secondary | ICD-10-CM | POA: Diagnosis not present

## 2018-03-12 ENCOUNTER — Ambulatory Visit (INDEPENDENT_AMBULATORY_CARE_PROVIDER_SITE_OTHER): Payer: 59

## 2018-03-12 ENCOUNTER — Ambulatory Visit (INDEPENDENT_AMBULATORY_CARE_PROVIDER_SITE_OTHER): Payer: 59 | Admitting: Podiatry

## 2018-03-12 ENCOUNTER — Encounter: Payer: Self-pay | Admitting: Podiatry

## 2018-03-12 DIAGNOSIS — L600 Ingrowing nail: Secondary | ICD-10-CM

## 2018-03-12 DIAGNOSIS — D492 Neoplasm of unspecified behavior of bone, soft tissue, and skin: Secondary | ICD-10-CM | POA: Diagnosis not present

## 2018-03-12 MED ORDER — NEOMYCIN-POLYMYXIN-HC 3.5-10000-1 OT SOLN: OTIC | 0 refills | Status: DC

## 2018-03-12 NOTE — Patient Instructions (Signed)

## 2018-03-17 NOTE — Progress Notes (Signed)
Subjective:   Patient ID: Andrew Woods, male   DOB: 63 y.o.   MRN: [id number]   HPI Patient presents stating that he is improving on the bottom of his big toe but he has developed ingrown toenail of his left big toe which is really bothersome and would like to have it corrected as he continues to heal from the plantar foot   ROS      Objective:  Physical Exam  Neurovascular status intact muscle strength is adequate with patient's left hallux showing incurvation of the borders both medial lateral borders with well-healed surgical site plantar aspect left toe from excision of sesamoidal bone     Assessment:  Doing well with sesamoidal plantar excision with incurvated chronic nail bed left hallux that is painful     Plan:  H&P conditions reviewed and recommended correction of ingrown toenail.  Explained procedure and risk and today patient signed consent form.  I infiltrated the left hallux 60 mg like Marcaine mixture I did sterile prep of the toe and using sterile instrumentation remove the medial lateral border exposed matrix and applied phenol 3 applications 30 seconds followed by alcohol lavage and sterile dressing.  Gave instructions on soaks and I encouraged him to leave dressing on 24 hours but to take it off earlier if it should start to throb.  For the plantar he will continue with surgical shoe and elevation and will be reevaluated again in the next 3 weeks  X-ray indicates that there is satisfactory resection of bone with good alignment and no signs of structural pathology

## 2018-03-21 DIAGNOSIS — H35413 Lattice degeneration of retina, bilateral: Secondary | ICD-10-CM | POA: Diagnosis not present

## 2018-03-21 DIAGNOSIS — H35032 Hypertensive retinopathy, left eye: Secondary | ICD-10-CM | POA: Diagnosis not present

## 2018-03-21 DIAGNOSIS — H34831 Tributary (branch) retinal vein occlusion, right eye, with macular edema: Secondary | ICD-10-CM | POA: Diagnosis not present

## 2018-03-26 ENCOUNTER — Ambulatory Visit (INDEPENDENT_AMBULATORY_CARE_PROVIDER_SITE_OTHER): Payer: 59

## 2018-03-26 ENCOUNTER — Encounter: Payer: Self-pay | Admitting: Podiatry

## 2018-03-26 ENCOUNTER — Ambulatory Visit (INDEPENDENT_AMBULATORY_CARE_PROVIDER_SITE_OTHER): Payer: 59 | Admitting: Podiatry

## 2018-03-26 DIAGNOSIS — D169 Benign neoplasm of bone and articular cartilage, unspecified: Secondary | ICD-10-CM

## 2018-03-26 DIAGNOSIS — D492 Neoplasm of unspecified behavior of bone, soft tissue, and skin: Secondary | ICD-10-CM

## 2018-03-26 DIAGNOSIS — L600 Ingrowing nail: Secondary | ICD-10-CM

## 2018-03-26 NOTE — Progress Notes (Signed)
Subjective:   Patient ID: Andrew Woods, male   DOB: 63 y.o.   MRN: [id number]   HPI Patient states overall doing real well with occasional pain and states doing better after having the nail taken care of   ROS      Objective:  Physical Exam  Neurovascular status intact with crusted tissue sub-left hallux from previous procedure with mild swelling and numbness with healed ingrown toenail left hallux lateral border     Assessment:  I do think this is a traumatized area and inflamed but it is on the path to recovery based on the clinical appearance     Plan:  H&P x-ray reviewed and at this point I have recommended continued cushion padding type therapy for this and the consideration for return to soft shoes with return to work  X-ray indicates satisfactory resection of bone with no indications of bone pathology

## 2018-04-02 ENCOUNTER — Other Ambulatory Visit: Payer: Self-pay

## 2018-04-02 ENCOUNTER — Ambulatory Visit (INDEPENDENT_AMBULATORY_CARE_PROVIDER_SITE_OTHER): Payer: Self-pay

## 2018-04-02 DIAGNOSIS — L600 Ingrowing nail: Secondary | ICD-10-CM

## 2018-04-09 NOTE — Progress Notes (Signed)
Patient is here today for follow-up appointment, recent procedure performed on 03/12/2018, removal of bilateral hallux nail borders left toe.  He states that overall the area is improved, he was concerned with swelling at his last visit, and that has improved.  No redness, no swelling, no erythema, no drainage and no other signs of infection.  The area appears to be healing well and is scabbed over at this time.  Dispensed silicone toe cap for him to wear over his toe when wearing shoes.  Discussed signs and symptoms of infection, verbal and written instructions were given to the patient.  He is to follow-up as needed with any acute symptom changes.

## 2018-04-18 DIAGNOSIS — H34831 Tributary (branch) retinal vein occlusion, right eye, with macular edema: Secondary | ICD-10-CM | POA: Diagnosis not present

## 2018-05-07 ENCOUNTER — Encounter: Payer: Self-pay | Admitting: Podiatry

## 2018-05-07 ENCOUNTER — Ambulatory Visit (INDEPENDENT_AMBULATORY_CARE_PROVIDER_SITE_OTHER): Payer: 59

## 2018-05-07 ENCOUNTER — Other Ambulatory Visit: Payer: Self-pay

## 2018-05-07 ENCOUNTER — Ambulatory Visit (INDEPENDENT_AMBULATORY_CARE_PROVIDER_SITE_OTHER): Payer: 59 | Admitting: Podiatry

## 2018-05-07 VITALS — Temp 95.7°F

## 2018-05-07 DIAGNOSIS — D492 Neoplasm of unspecified behavior of bone, soft tissue, and skin: Secondary | ICD-10-CM

## 2018-05-07 DIAGNOSIS — D169 Benign neoplasm of bone and articular cartilage, unspecified: Secondary | ICD-10-CM

## 2018-05-08 NOTE — Progress Notes (Signed)
Subjective:   Patient ID: Andrew Woods, male   DOB: 63 y.o.   MRN: [id number]   HPI Patient states feeling a lot better with the left foot and states that he is wearing his normal shoe gear currently   ROS      Objective:  Physical Exam  Neurovascular status intact negative Homans sign noted with excision of plantar condyle and sesamoid left healing well with wound edges well coapted     Assessment:  Doing well post surgery left     Plan:  Advised on padding gradual return to normal activity and return to all shoe types

## 2018-05-16 DIAGNOSIS — H34831 Tributary (branch) retinal vein occlusion, right eye, with macular edema: Secondary | ICD-10-CM | POA: Diagnosis not present

## 2018-06-13 DIAGNOSIS — H34831 Tributary (branch) retinal vein occlusion, right eye, with macular edema: Secondary | ICD-10-CM | POA: Diagnosis not present

## 2018-12-26 ENCOUNTER — Other Ambulatory Visit: Payer: Self-pay

## 2018-12-26 ENCOUNTER — Ambulatory Visit (INDEPENDENT_AMBULATORY_CARE_PROVIDER_SITE_OTHER): Payer: 59 | Admitting: Podiatry

## 2018-12-26 ENCOUNTER — Encounter: Payer: Self-pay | Admitting: Podiatry

## 2018-12-26 ENCOUNTER — Ambulatory Visit (INDEPENDENT_AMBULATORY_CARE_PROVIDER_SITE_OTHER): Payer: 59

## 2018-12-26 DIAGNOSIS — D492 Neoplasm of unspecified behavior of bone, soft tissue, and skin: Secondary | ICD-10-CM | POA: Diagnosis not present

## 2018-12-26 DIAGNOSIS — D169 Benign neoplasm of bone and articular cartilage, unspecified: Secondary | ICD-10-CM | POA: Diagnosis not present

## 2018-12-29 NOTE — Progress Notes (Signed)
Subjective:   Patient ID: Andrew Woods, male   DOB: 63 y.o.   MRN: [id number]   HPI Patient states that he does not have the same pain that he used to but he states that he gets soreness on the bottom of his big toe left that occurs after standing on his foot for a number of hours   ROS      Objective:  Physical Exam  Neurovascular status intact with patient found to have minimal keratotic lesion plantar aspect left big toe that is localized with no indication of proximal issues     Assessment:  Keratotic lesion formation left that is thinner than it was previously and appears to be more in the crease of the hallux itself     Plan:  Explained the position of the toe is been a big part of the problem and discussed the friction developing and I did carefully debride tissue and then applied a pad to the toe to try to cushion it.  Patient will be seen back for [place] to recheck  X-ray indicates that there is satisfactory removal of bone and no indication of pathology from the standpoint

## 2018-12-31 IMAGING — MR MR LUMBAR SPINE W/O CM
4 of 5 series · 18 of 48 positions shown · non-contrast
Comparison: MRI lumbar spine 02/22/2003.

CLINICAL DATA: Chronic low back pain radiating into the right leg.
No recent injury.

EXAM:
MRI LUMBAR SPINE WITHOUT CONTRAST
TECHNIQUE: Multiplanar, multisequence MR imaging of the lumbar spine was
performed. No intravenous contrast was administered.

[Series 6: T2 · sagittal · 4.0mm · 0.73mm/px · 6 of 15 slices shown (1 of 2)]
[im 1/15]
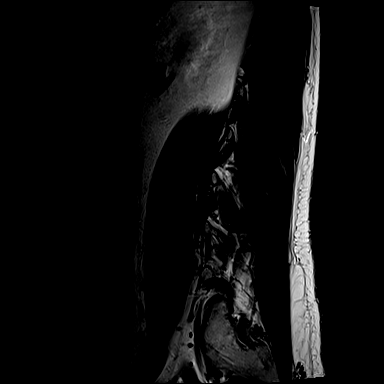
[im 3/15]
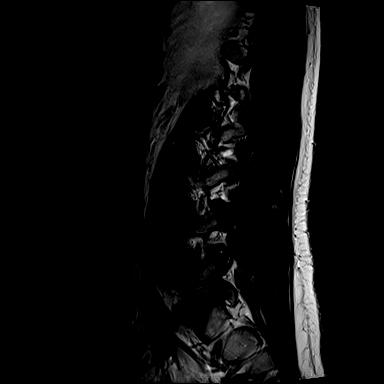
[im 6/15]
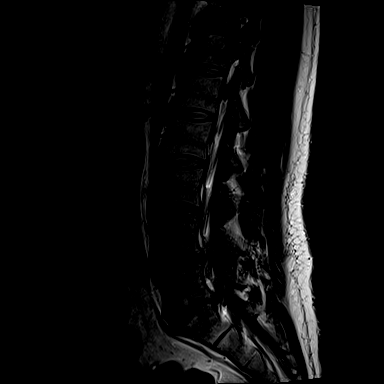
[im 9/15]
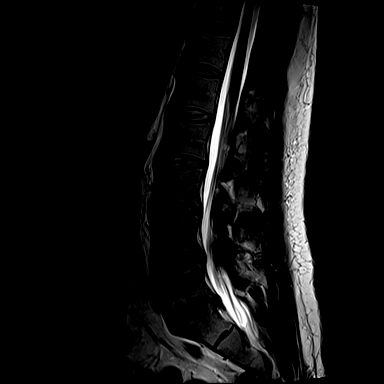
[im 12/15]
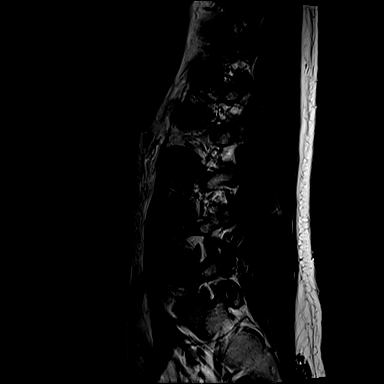
[im 15/15]
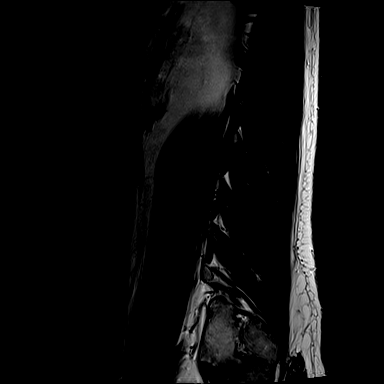

[Series 7: T1 · sagittal · 4.0mm · 0.73mm/px · 3 of 15 slices shown (1 of 2)]
[im 3/15]
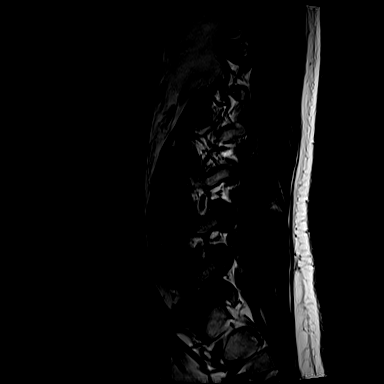
[im 9/15]
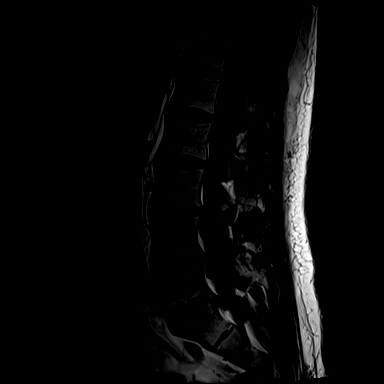
[im 15/15]
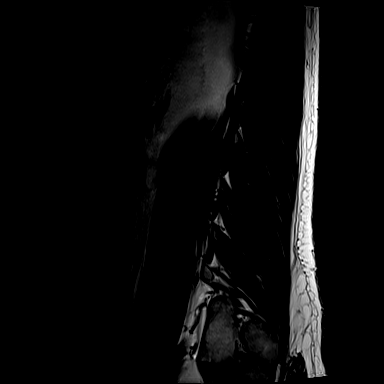

[Series 13: T2 · axial · 4.0mm · 0.28mm/px · z∈[+4,+201]mm · 6 of 41 slices shown (2 of 2)]
[im 1/41]
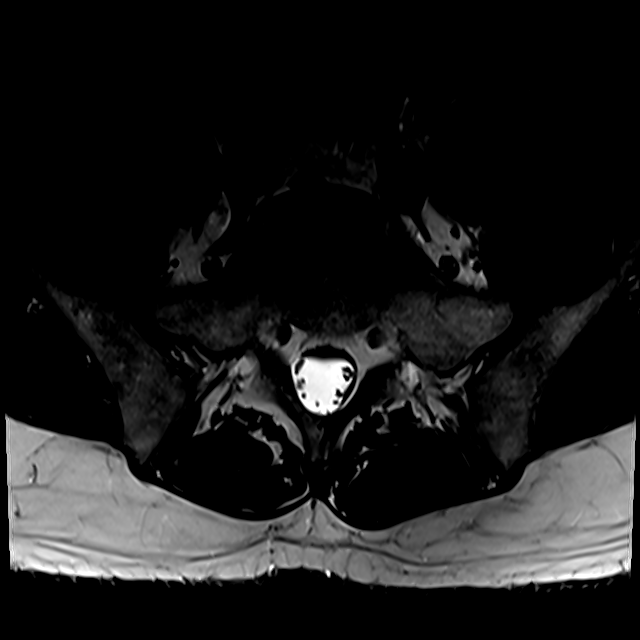
[im 6/41]
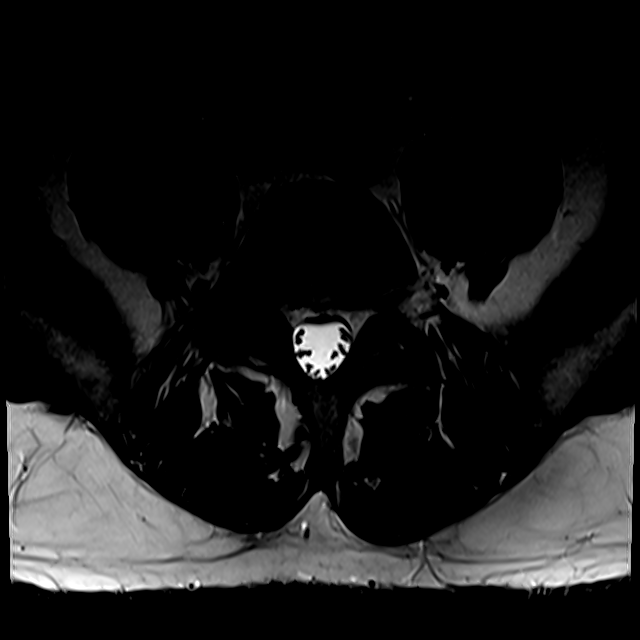
[im 12/41]
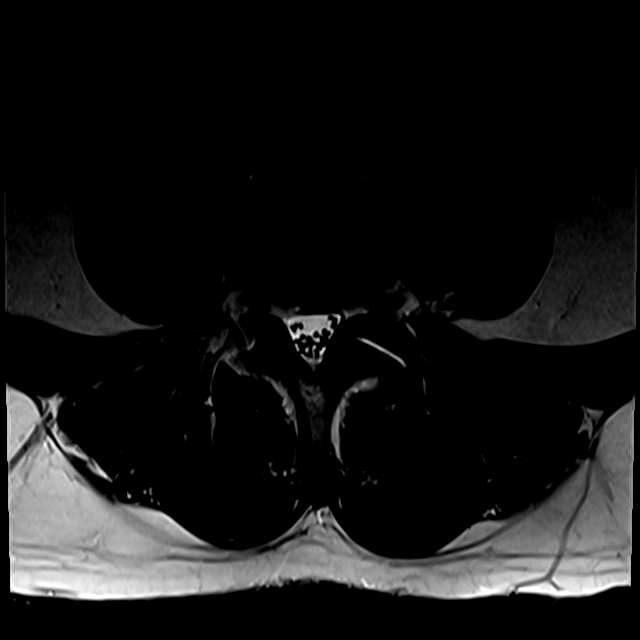
[im 18/41]
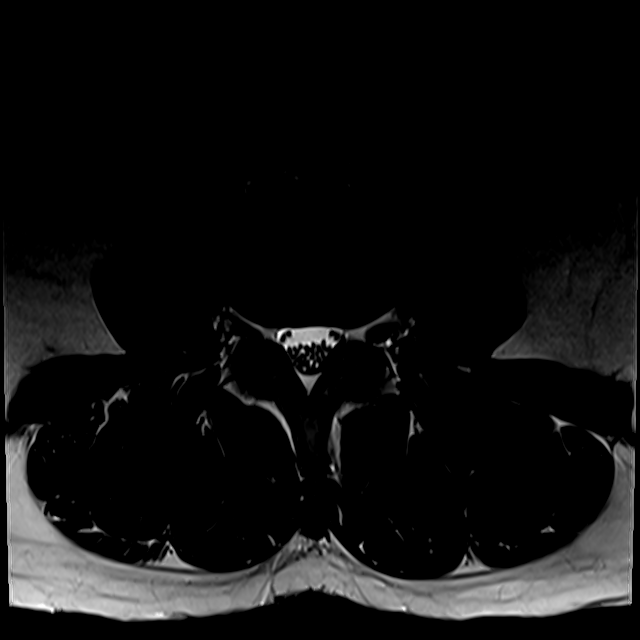
[im 21/41]
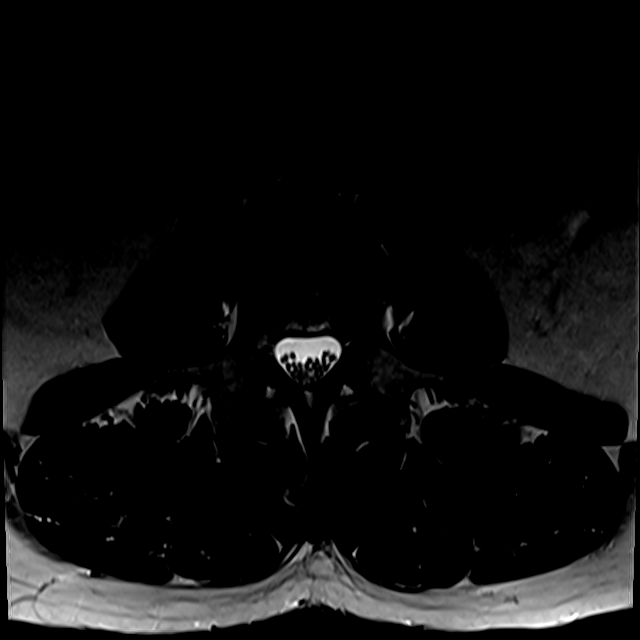
[im 35/41]
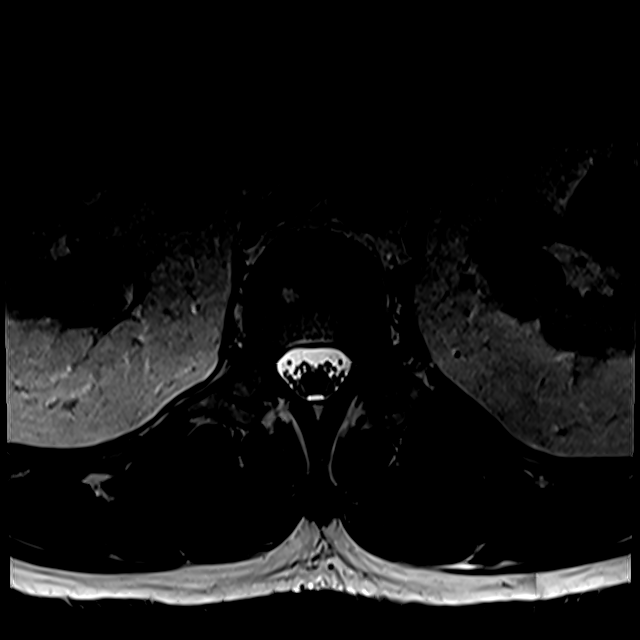

[Series 100: T1 · axial · 4.0mm · 0.28mm/px · z∈[+29,+201]mm · 3 of 41 slices shown (2 of 2)]
[im 6/41]
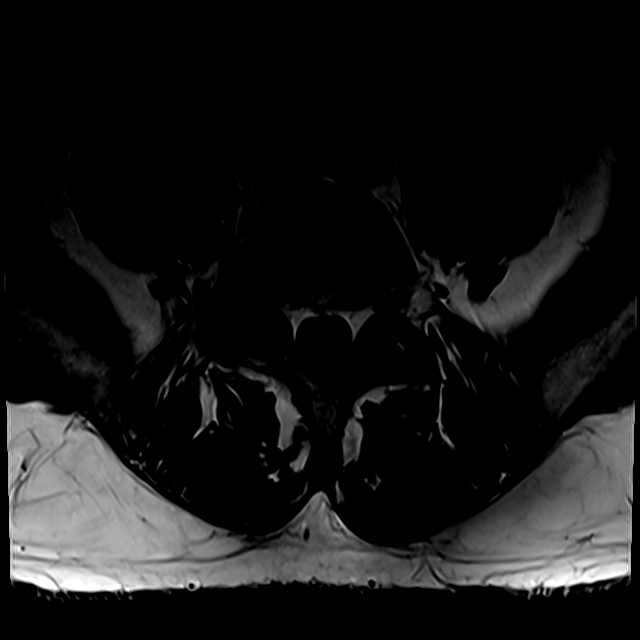
[im 21/41]
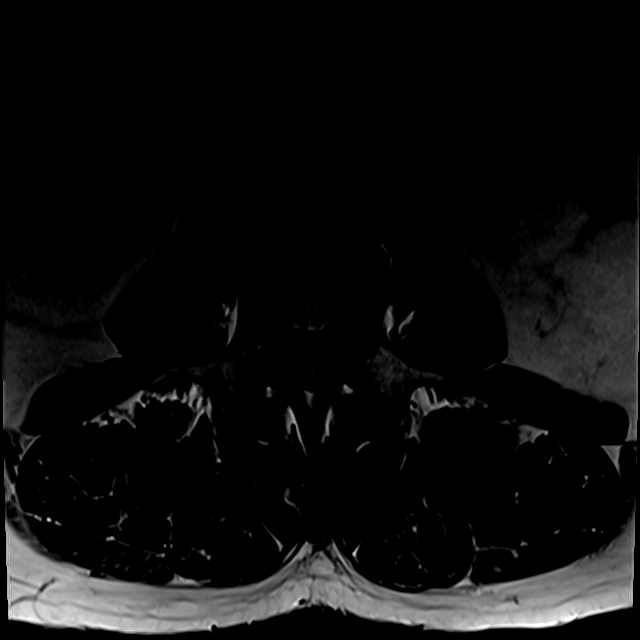
[im 35/41]
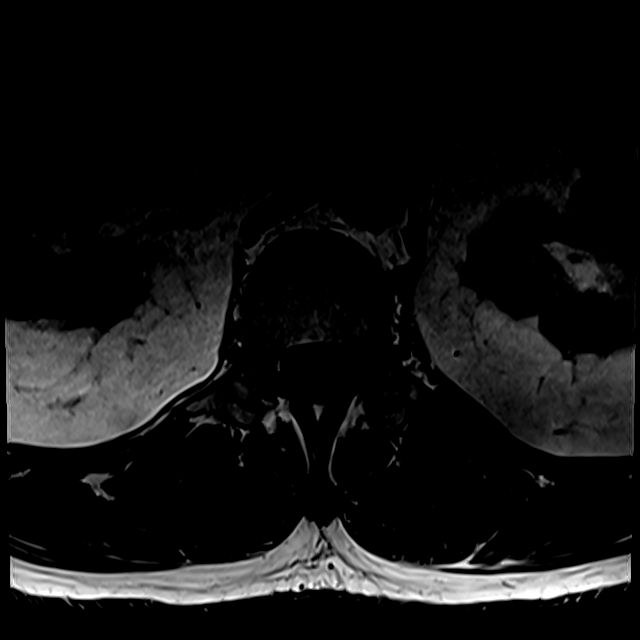

[18 of 48 positions shown; findings below may reference images not displayed]

FINDINGS: Segmentation:  Standard.

Alignment:  Maintained.

Vertebrae: No fracture or worrisome lesion. There is some
degenerative endplate signal change at L5-S1 which is new since the
prior MRI.

Conus medullaris and cauda equina: Conus extends to the L1 level.
Conus and cauda equina appear normal.

Paraspinal and other soft tissues: Negative.

Disc levels:

T11-12 is imaged in the sagittal plane only and negative.

T12-L1: Negative.

L1-2: Negative.

L2-3: Negative.

L3-4: Negative.

L4-5: There has been some progression of degenerative disease at
this level. The disc is desiccated with a very shallow right
paracentral protrusion and an annular fissure. The central spinal
canal and neural foramina remain open.

L5-S1: Disc desiccation and a shallow broad-based central protrusion
more prominent to the right are seen. Moderate to moderately severe
right foraminal stenosis and mild narrowing in the right
subarticular recess appear unchanged. Left foramen is open. The
appearance is unchanged.
IMPRESSION: New small right paracentral protrusion and annular fissure at L4-5
without stenosis.

No change in a broad-based central and eccentric to the right
protrusion at L5-S1 which causes mild narrowing in the right
subarticular recess and moderate to moderately severe right
foraminal narrowing.

## 2019-10-09 ENCOUNTER — Ambulatory Visit: Payer: 59 | Admitting: Podiatry

## 2019-10-14 ENCOUNTER — Ambulatory Visit: Payer: 59 | Admitting: Podiatry

## 2019-10-21 ENCOUNTER — Encounter: Payer: Self-pay | Admitting: Podiatry

## 2019-10-21 ENCOUNTER — Ambulatory Visit (INDEPENDENT_AMBULATORY_CARE_PROVIDER_SITE_OTHER): Payer: 59

## 2019-10-21 ENCOUNTER — Ambulatory Visit (INDEPENDENT_AMBULATORY_CARE_PROVIDER_SITE_OTHER): Payer: 59 | Admitting: Podiatry

## 2019-10-21 ENCOUNTER — Other Ambulatory Visit: Payer: Self-pay

## 2019-10-21 DIAGNOSIS — R52 Pain, unspecified: Secondary | ICD-10-CM

## 2019-10-21 DIAGNOSIS — L600 Ingrowing nail: Secondary | ICD-10-CM

## 2019-10-21 NOTE — Progress Notes (Signed)
Subjective:   Patient ID: Andrew Woods, male   DOB: 64 y.o.   MRN: [id number]   HPI Patient presents stating that he is having the callus on the bottom of his left foot and also he has a nail spicule that is bothersome on the left big toe.  States he gets irritated and he like to have it taken care of   ROS      Objective:  Physical Exam  Neurovascular status intact with plantar keratotic lesion that is not the same spot that is when we did the surgery but present in her small Ortho porokeratotic lesion associated with it and early spicule of nail on the medial side left hallux     Assessment:  Ingrown toenail deformity left hallux medial side along with small porokeratotic plantar lesion     Plan:  Recommended removal of the spicule and I allowed patient to read consent form and signed for correction.  I infiltrated the left hallux 60 mg like Marcaine mixture sterile prep done and using sterile instrumentation I removed this spicule and applied phenol 3 applications 30 seconds followed by alcohol lavage sterile dressing.  I then debrided the lesion on the plantar aspect hallux plan a small amount of salicylic acid with padding and patient will take the dressing off tomorrow.  Patient will be seen back as needed is encouraged to call with questions concerns

## 2020-08-03 ENCOUNTER — Other Ambulatory Visit: Payer: Self-pay

## 2020-08-03 ENCOUNTER — Encounter: Payer: Self-pay | Admitting: Podiatry

## 2020-08-03 ENCOUNTER — Ambulatory Visit (INDEPENDENT_AMBULATORY_CARE_PROVIDER_SITE_OTHER): Payer: 59 | Admitting: Podiatry

## 2020-08-03 DIAGNOSIS — L600 Ingrowing nail: Secondary | ICD-10-CM

## 2020-08-03 NOTE — Patient Instructions (Signed)

## 2020-08-04 ENCOUNTER — Telehealth: Payer: Self-pay | Admitting: *Deleted

## 2020-08-04 NOTE — Progress Notes (Signed)
Subjective:   Patient ID: Andrew Woods, male   DOB: 65 y.o.   MRN: [id number]   HPI Patient presents stating that she has a split left big toenail and that he is concerned about what may happen in the future with me painful now or not draining   ROS      Objective:  Physical Exam  Neurovascular status intact muscle strength adequate range of motion adequate patient found to have an incurvated left hallux medial border with a spicule present that is moderately tender when pressed.  Patient is found to have good digital perfusion well oriented x3     Assessment:  Spicule left hallux nail medial border mildly painful     Plan:  Recommended removal of this nail spicule and explained procedure risk and he signed consent form.  I infiltrated the left hallux 60 mg like Marcaine mixture sterile prep removed the medial border exposed matrix applied phenol 3 applications 30 seconds and applied sterile dressing.  Gave instructions on soaks reappoint as needed

## 2020-08-04 NOTE — Telephone Encounter (Signed)
Patient is calling because he wants to know how long to soak his procedural toe after border nail removal one day ago.   Returned call to patient and explained that he should continue to soak the toe until there is no more drainage from site,verbalized understanding.

## 2024-03-04 ENCOUNTER — Ambulatory Visit: Admitting: Podiatry
